# Patient Record
Sex: Female | Born: 1995 | Race: White | Hispanic: No | Marital: Single | Smoking: Current every day smoker
Health system: Southern US, Community
[De-identification: ages and names within clinical notes are randomized; demographics above are authoritative.]

## PROBLEM LIST (undated history)

## (undated) HISTORY — PX: NO PAST SURGERIES: SHX2092

---
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
## 2015-02-10 IMAGING — CR DG KNEE 1-2V*R*
1 series · 2 of 2 positions shown · non-contrast
Comparison: None.

CLINICAL DATA: Right knee gave out while dancing.  Right knee pain.

EXAM:
RIGHT KNEE - 1-2 VIEW

[Series 1: ap · 0.17mm/px · 2 of 2 slices shown]
[im 1/2]
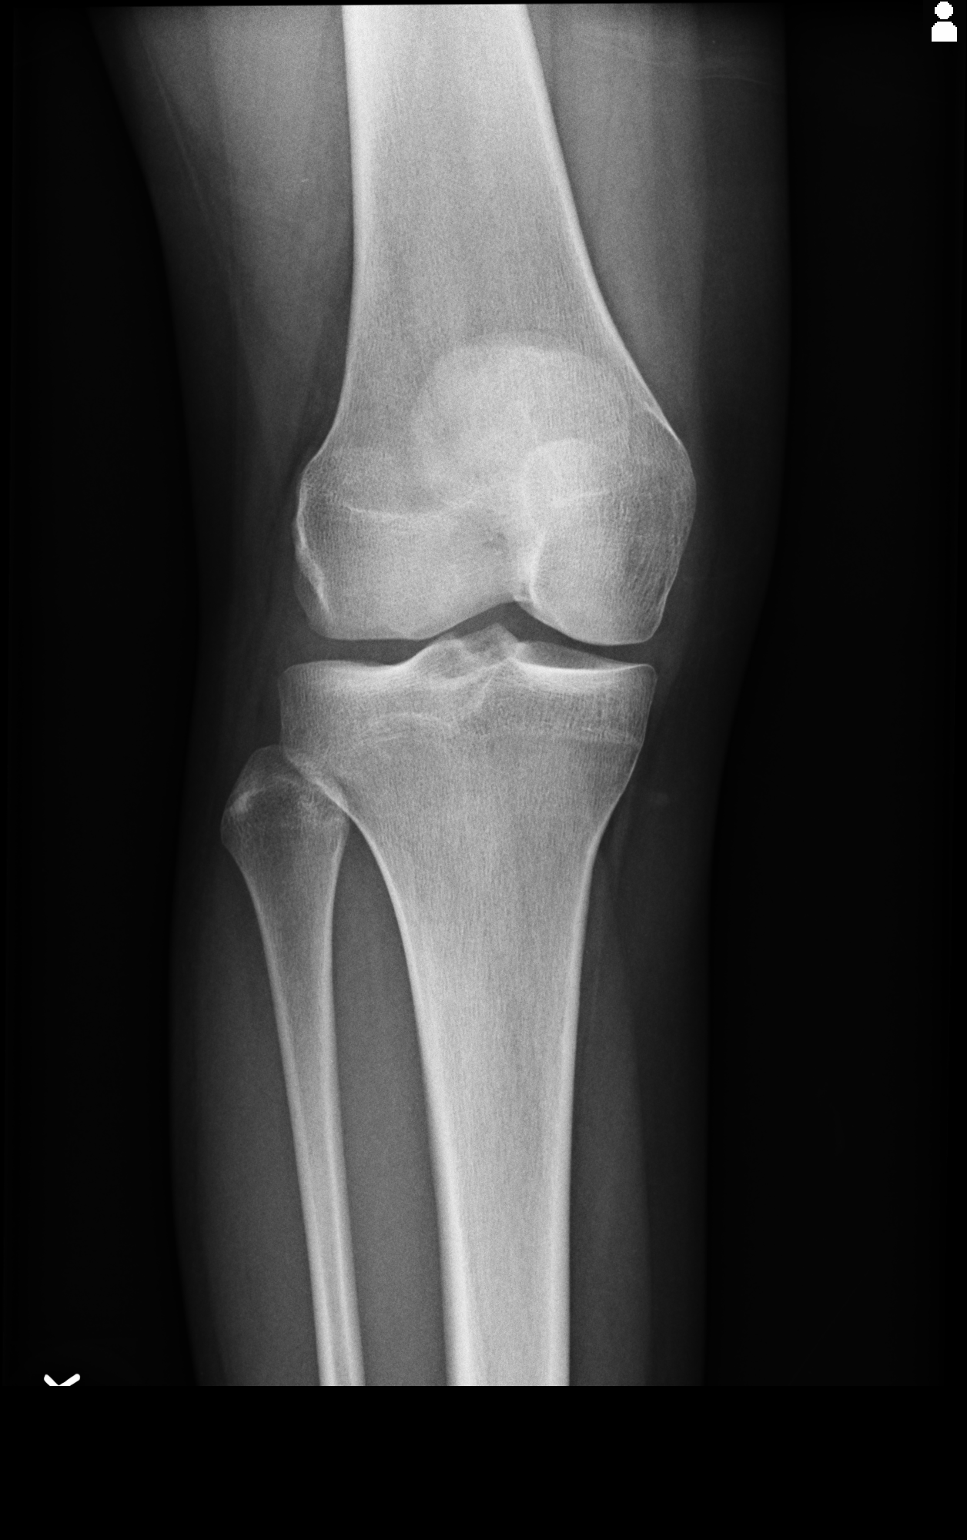
[im 2/2]
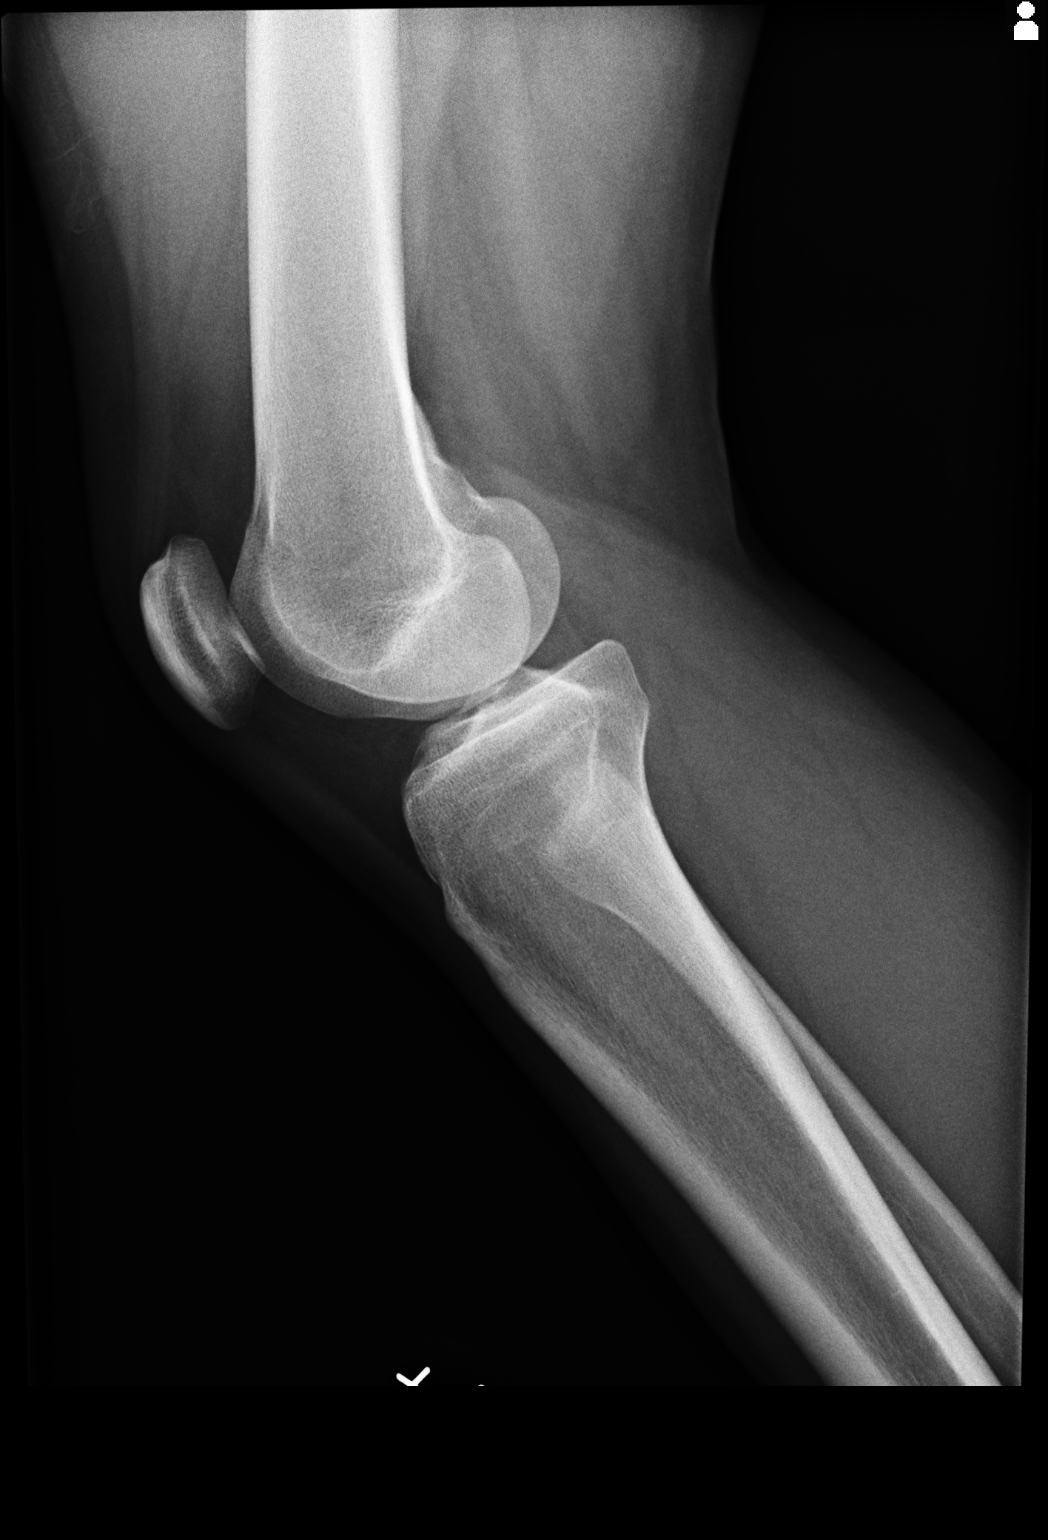

[2 of 2 positions shown; findings below may reference images not displayed]

FINDINGS: There is no evidence of fracture or dislocation. The joint spaces
are preserved. No significant degenerative change is seen; the
patellofemoral joint is grossly unremarkable in appearance.

No significant joint effusion is seen. The visualized soft tissues
are normal in appearance.
IMPRESSION: No evidence of fracture or dislocation.

## 2018-06-29 ENCOUNTER — Ambulatory Visit
Admission: EM | Admit: 2018-06-29 | Discharge: 2018-06-29 | Disposition: A | Discharge status: Home / Self Care | Payer: BLUE CROSS/BLUE SHIELD | Attending: Family Medicine | Admitting: Family Medicine

## 2018-06-29 ENCOUNTER — Other Ambulatory Visit: Payer: Self-pay

## 2018-06-29 ENCOUNTER — Encounter: Payer: Self-pay | Admitting: Emergency Medicine

## 2018-06-29 DIAGNOSIS — L559 Sunburn, unspecified: Secondary | ICD-10-CM

## 2018-06-29 MED ORDER — MUPIROCIN 2 % EX OINT: TOPICAL_OINTMENT | CUTANEOUS | 1 refills | Status: AC

## 2018-06-29 MED ORDER — OXYCODONE-ACETAMINOPHEN 5-325 MG PO TABS: 1.0000 | ORAL_TABLET | Freq: Three times a day (TID) | ORAL | 0 refills | Status: AC | PRN

## 2018-06-29 NOTE — ED Triage Notes (Signed)
Pt c/o sunburn on her back, the back of her legs, face and hands. Started 2 days ago. She also has chills, swelling and shaking. No known blisters.

## 2018-06-29 NOTE — Discharge Instructions (Addendum)
Take and use medication as prescribed. Rest. Drink plenty of fluids!!! Cool compresses. Aloe. Continue ibuprofen over the counter. Sunscreen in future.   Follow up with your primary care physician this week as needed. Return to Urgent care for new or worsening concerns.

## 2018-06-29 NOTE — ED Provider Notes (Signed)
[facility] ____________________________________________  Time seen: Approximately 11:18 AM  I have reviewed the triage vital signs and the nursing notes.   HISTORY  Chief Complaint Sunburn   HPI Julie Hunt is a 23 y.o. female presenting for evaluation of sunburn.  Patient states this past weekend Friday and Saturday she laid out by the pool.  States she did put some sunscreen on her front part of her body, but the back side got sunburned.  States she did try some Aveeno oatmeal bath last night without resolution.  Has been taken ibuprofen and drinking lots of fluids.  States her back feels tight and somewhat swollen.  States her back and the back of her thighs are painful, stinging and tingly.  Denies known blisters.  Denies other triggers.  Denies fevers, decreased range of motion, recent sickness, dizziness, syncope or near syncope.  Reports otherwise doing well.  Patient's last menstrual period was 06/26/2018.  Denies pregnancy.   History reviewed. No pertinent past medical history.  There are no active problems to display for this patient.   Past Surgical History:  Procedure Laterality Date  . NO PAST SURGERIES       No current facility-administered medications for this encounter.   Current Outpatient Medications:  .  levonorgestrel-ethinyl estradiol (ALESSE) 0.1-20 MG-MCG tablet, Take 1 tablet by mouth daily., Disp: , Rfl:  .  mupirocin ointment (BACTROBAN) 2 %, Apply two times a day for 7 days., Disp: 22 g, Rfl: 1 .  oxyCODONE-acetaminophen (PERCOCET/ROXICET) 5-325 MG tablet, Take 1 tablet by mouth every 8 (eight) hours as needed for severe pain. Do not drive while taking as can cause drowsiness., Disp: 3 tablet, Rfl: 0  Allergies Septra [sulfamethoxazole-trimethoprim]  Family History  Problem Relation Age of Onset  . Healthy Mother   . Healthy Father     Social History Social History   Tobacco Use  . Smoking status: Current Every Day Smoker     Packs/day: 0.25  . Smokeless tobacco: Never Used  Substance Use Topics  . Alcohol use: Yes  . Drug use: Not Currently    Review of Systems Constitutional: No fever. Cardiovascular: Denies chest pain. Respiratory: Denies shortness of breath. Gastrointestinal: No abdominal pain.  No nausea, no vomiting.  No diarrhea.  Musculoskeletal: Sun burn pain.  Denies injury. Skin: Positive sunburn.  ____________________________________________   PHYSICAL EXAM:  VITAL SIGNS: ED Triage Vitals  Enc Vitals Group     BP 06/29/18 1100 127/84     Pulse Rate 06/29/18 1100 82     Resp 06/29/18 1100 16     Temp 06/29/18 1100 98.1 F (36.7 C)     Temp Source 06/29/18 1100 Oral     SpO2 06/29/18 1100 100 %     Weight 06/29/18 1056 200 lb (90.7 kg)     Height 06/29/18 1056 5\' 7"  (1.702 m)     Head Circumference --      Peak Flow --      Pain Score 06/29/18 1056 6     Pain Loc --      Pain Edu? --      Excl. in GC? --     Constitutional: Alert and oriented. Well appearing and in no acute distress. ENT      Head: Normocephalic and atraumatic. Cardiovascular: Normal rate, regular rhythm. Grossly normal heart sounds.  Good peripheral circulation. Respiratory: Normal respiratory effort without tachypnea nor retractions. Breath sounds are clear and equal bilaterally. No wheezes, rales, rhonchi. Musculoskeletal: Steady  gait.  Bilateral distal pedal pulses equal and easily palpated.  No extremity edema noted. Neurologic:  Normal speech and language. Speech is normal. No gait instability.  Skin:  Skin is warm, dry.  Except: Diffuse erythema to back, proximal posterior upper arms and bilateral thighs with clear clothing outline demarcation, skin blanchable, skin intact without visible blisters, diffuse tenderness, no clear edema, no weeping. Psychiatric: Mood and affect are normal. Speech and behavior are normal. Patient exhibits appropriate insight and judgment    ___________________________________________   LABS (all labs ordered are listed, but only abnormal results are displayed)  Labs Reviewed - No data to display ____________________________________________   PROCEDURES Procedures    INITIAL IMPRESSION / ASSESSMENT AND PLAN / ED COURSE  Pertinent labs & imaging results that were available during my care of the patient were reviewed by me and considered in my medical decision making (see chart for details).  Well-appearing patient.  Diffuse posterior sunburn.  Skin intact.  Sulfa allergic.  Will treat with mupirocin, discussed with patient to use blisters and skin breaks.  Cool compresses, continue over-the-counter ibuprofen, quantity 3 Percocet given as needed for breakthrough pain.  Continue to push fluids and hydration supportive care.  Avoidance of repeat over sun exposure.Discussed indication, risks and benefits of medications with patient.  Discussed follow up with Primary care physician this week. Discussed follow up and return parameters including no resolution or any worsening concerns. Patient verbalized understanding and agreed to plan.   [place] controlled substance database reviewed, no recent controlled substances documented.  ____________________________________________   FINAL CLINICAL IMPRESSION(S) / ED DIAGNOSES  Final diagnoses:  Burn from the sun     ED Discharge Orders         Ordered    mupirocin ointment (BACTROBAN) 2 %     06/29/18 1117    oxyCODONE-acetaminophen (PERCOCET/ROXICET) 5-325 MG tablet  Every 8 hours PRN     06/29/18 1117           Note: This dictation was prepared with Dragon dictation along with smaller phrase technology. Any transcriptional errors that result from this process are unintentional.         Renford DillsMiller, Labrandon Knoch, NP 06/29/18 1256

## 2019-08-05 ENCOUNTER — Emergency Department (HOSPITAL_COMMUNITY)
Admission: EM | Admit: 2019-08-05 | Discharge: 2019-08-05 | Discharge status: Against Medical Advice | Payer: BC Managed Care – PPO

## 2019-08-05 ENCOUNTER — Other Ambulatory Visit: Payer: Self-pay

## 2019-08-05 NOTE — ED Triage Notes (Signed)
Pt called again from triage with no answer 

## 2019-08-05 NOTE — ED Triage Notes (Signed)
Pt called for triage with no answer
